# Patient Record
Sex: Female | Born: 1965 | Marital: Married
Health system: Southern US, Community
[De-identification: ages and names within clinical notes are randomized; demographics above are authoritative.]

---

## 2012-02-02 ENCOUNTER — Other Ambulatory Visit (HOSPITAL_COMMUNITY): Payer: Self-pay | Admitting: Family Medicine

## 2012-02-02 DIAGNOSIS — Z1231 Encounter for screening mammogram for malignant neoplasm of breast: Secondary | ICD-10-CM

## 2012-02-22 ENCOUNTER — Ambulatory Visit (HOSPITAL_COMMUNITY): Payer: Self-pay

## 2012-03-06 ENCOUNTER — Ambulatory Visit (HOSPITAL_COMMUNITY)
Admission: RE | Admit: 2012-03-06 | Discharge: 2012-03-06 | Disposition: A | Discharge status: Home / Self Care | Payer: Managed Care, Other (non HMO) | Source: Ambulatory Visit | Attending: Family Medicine | Admitting: Family Medicine

## 2012-03-06 DIAGNOSIS — Z1231 Encounter for screening mammogram for malignant neoplasm of breast: Secondary | ICD-10-CM | POA: Insufficient documentation

## 2012-10-02 ENCOUNTER — Other Ambulatory Visit (HOSPITAL_COMMUNITY)
Admission: RE | Admit: 2012-10-02 | Discharge: 2012-10-02 | Disposition: A | Discharge status: Home / Self Care | Payer: BC Managed Care – PPO | Source: Ambulatory Visit | Attending: Family Medicine | Admitting: Family Medicine

## 2012-10-02 ENCOUNTER — Other Ambulatory Visit: Payer: Self-pay | Admitting: Family Medicine

## 2012-10-02 DIAGNOSIS — Z124 Encounter for screening for malignant neoplasm of cervix: Secondary | ICD-10-CM | POA: Insufficient documentation

## 2012-10-04 ENCOUNTER — Other Ambulatory Visit: Payer: Self-pay | Admitting: Family Medicine

## 2012-10-04 DIAGNOSIS — E042 Nontoxic multinodular goiter: Secondary | ICD-10-CM

## 2012-10-10 ENCOUNTER — Other Ambulatory Visit: Payer: Managed Care, Other (non HMO)

## 2012-12-11 ENCOUNTER — Ambulatory Visit
Admission: RE | Admit: 2012-12-11 | Discharge: 2012-12-11 | Disposition: A | Discharge status: Home / Self Care | Payer: BC Managed Care – PPO | Source: Ambulatory Visit | Attending: Family Medicine | Admitting: Family Medicine

## 2012-12-11 DIAGNOSIS — E042 Nontoxic multinodular goiter: Secondary | ICD-10-CM

## 2012-12-14 ENCOUNTER — Other Ambulatory Visit: Payer: Self-pay | Admitting: Family Medicine

## 2012-12-14 DIAGNOSIS — E041 Nontoxic single thyroid nodule: Secondary | ICD-10-CM

## 2012-12-18 ENCOUNTER — Other Ambulatory Visit: Payer: Self-pay | Admitting: Family Medicine

## 2012-12-19 ENCOUNTER — Ambulatory Visit
Admission: RE | Admit: 2012-12-19 | Discharge: 2012-12-19 | Disposition: A | Discharge status: Home / Self Care | Payer: BC Managed Care – PPO | Source: Ambulatory Visit | Attending: Family Medicine | Admitting: Family Medicine

## 2012-12-19 ENCOUNTER — Other Ambulatory Visit (HOSPITAL_COMMUNITY)
Admission: RE | Admit: 2012-12-19 | Discharge: 2012-12-19 | Disposition: A | Discharge status: Home / Self Care | Payer: BC Managed Care – PPO | Source: Ambulatory Visit | Attending: Interventional Radiology | Admitting: Interventional Radiology

## 2012-12-19 DIAGNOSIS — E041 Nontoxic single thyroid nodule: Secondary | ICD-10-CM | POA: Insufficient documentation

## 2013-01-29 ENCOUNTER — Other Ambulatory Visit (HOSPITAL_COMMUNITY): Payer: Self-pay | Admitting: Family Medicine

## 2013-01-29 DIAGNOSIS — Z1231 Encounter for screening mammogram for malignant neoplasm of breast: Secondary | ICD-10-CM

## 2013-03-12 ENCOUNTER — Ambulatory Visit (HOSPITAL_COMMUNITY)
Admission: RE | Admit: 2013-03-12 | Discharge: 2013-03-12 | Disposition: A | Discharge status: Home / Self Care | Payer: BC Managed Care – PPO | Source: Ambulatory Visit | Attending: Family Medicine | Admitting: Family Medicine

## 2013-03-12 DIAGNOSIS — Z1231 Encounter for screening mammogram for malignant neoplasm of breast: Secondary | ICD-10-CM | POA: Insufficient documentation

## 2013-07-09 ENCOUNTER — Other Ambulatory Visit: Payer: Self-pay | Admitting: Internal Medicine

## 2013-07-09 DIAGNOSIS — E042 Nontoxic multinodular goiter: Secondary | ICD-10-CM

## 2013-09-03 ENCOUNTER — Ambulatory Visit
Admission: RE | Admit: 2013-09-03 | Discharge: 2013-09-03 | Disposition: A | Discharge status: Home / Self Care | Payer: BC Managed Care – PPO | Source: Ambulatory Visit | Attending: Internal Medicine | Admitting: Internal Medicine

## 2013-09-03 DIAGNOSIS — E042 Nontoxic multinodular goiter: Secondary | ICD-10-CM

## 2014-02-18 ENCOUNTER — Other Ambulatory Visit (HOSPITAL_COMMUNITY): Payer: Self-pay | Admitting: Family Medicine

## 2014-02-18 DIAGNOSIS — Z1231 Encounter for screening mammogram for malignant neoplasm of breast: Secondary | ICD-10-CM

## 2014-03-22 ENCOUNTER — Ambulatory Visit (HOSPITAL_COMMUNITY): Payer: PRIVATE HEALTH INSURANCE

## 2014-03-22 ENCOUNTER — Ambulatory Visit (HOSPITAL_COMMUNITY)
Admission: RE | Admit: 2014-03-22 | Discharge: 2014-03-22 | Disposition: A | Discharge status: Home / Self Care | Payer: PRIVATE HEALTH INSURANCE | Source: Ambulatory Visit | Attending: Family Medicine | Admitting: Family Medicine

## 2014-03-22 DIAGNOSIS — Z1231 Encounter for screening mammogram for malignant neoplasm of breast: Secondary | ICD-10-CM | POA: Insufficient documentation

## 2014-03-29 ENCOUNTER — Ambulatory Visit (HOSPITAL_COMMUNITY): Payer: PRIVATE HEALTH INSURANCE

## 2014-12-29 IMAGING — US US SOFT TISSUE HEAD/NECK
1 series · 13 of 25 positions shown · non-contrast
Comparison: Thyroid ultrasound from [place] 08/19/2010

CLINICAL DATA: Followup thyroid nodules. Previous thyroid biopsy on
the right February 2011 which showed benign follicular nodule.

EXAM:
THYROID ULTRASOUND
TECHNIQUE: Ultrasound examination of the thyroid gland and adjacent soft
tissues was performed.

[Series 1: us soft tissue head/neck · 0.06mm/px · 13 of 69 slices shown]
[im 1/69]
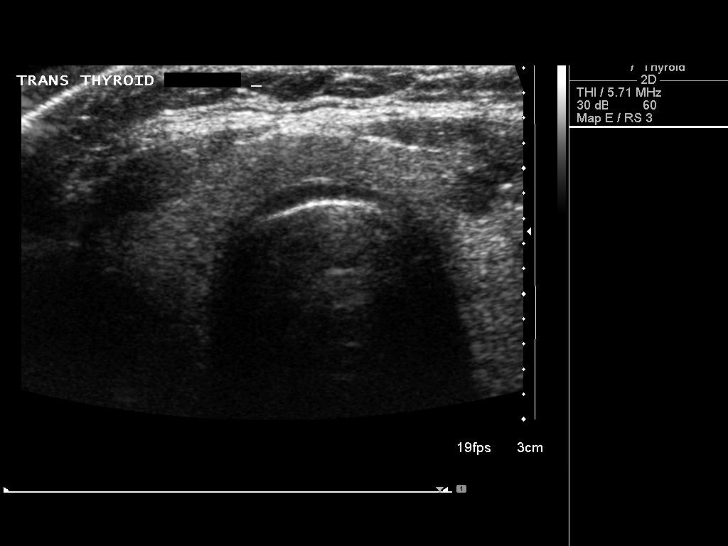
[im 6/69]
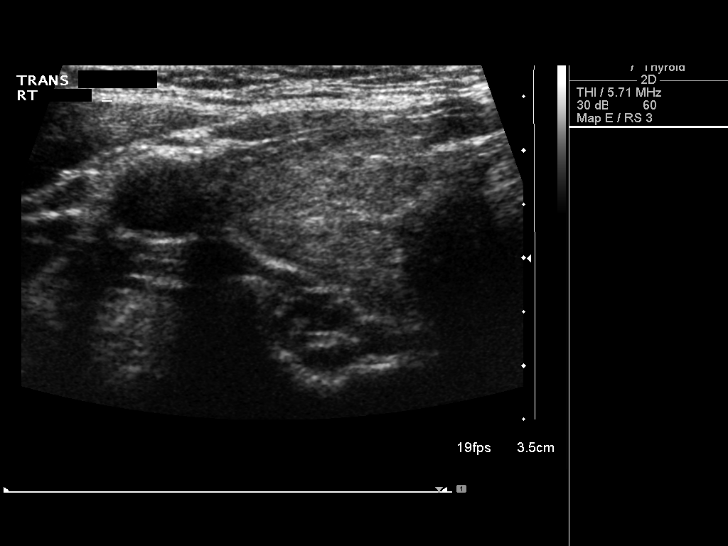
[im 12/69]
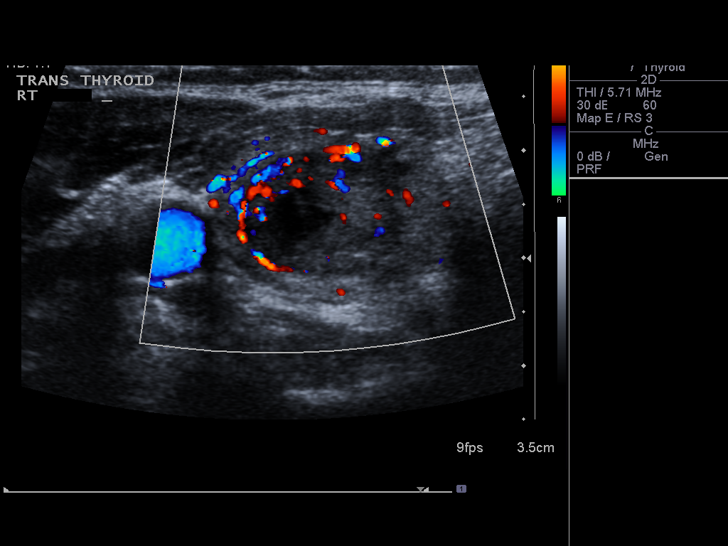
[im 18/69]
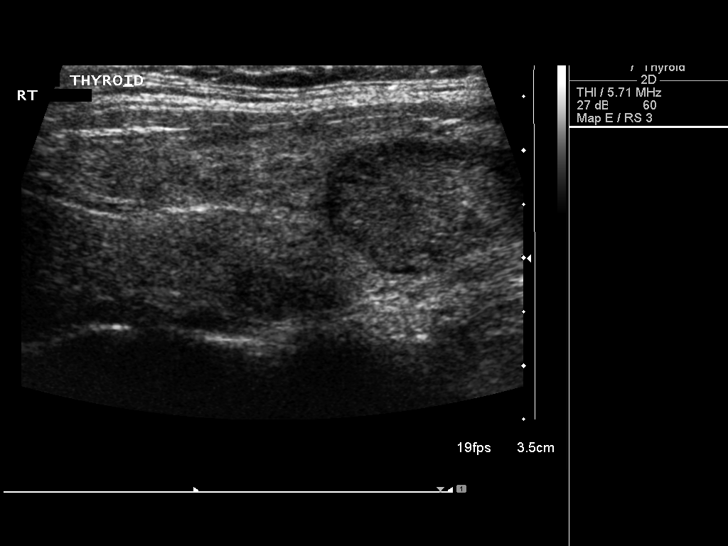
[im 23/69]
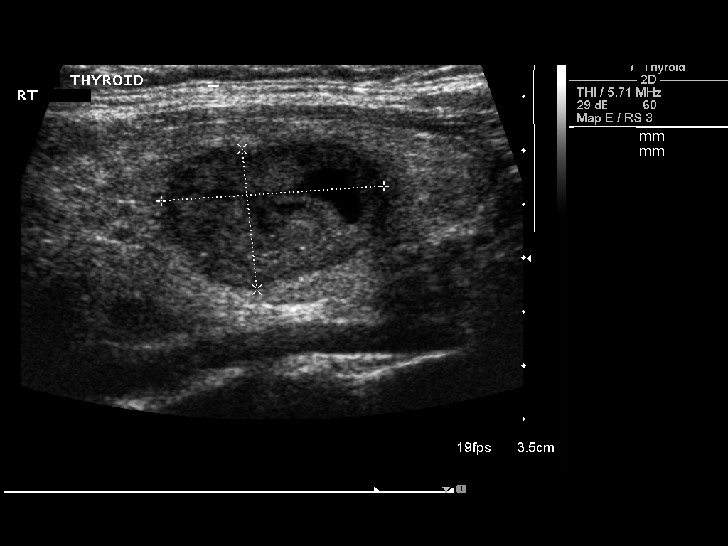
[im 29/69]
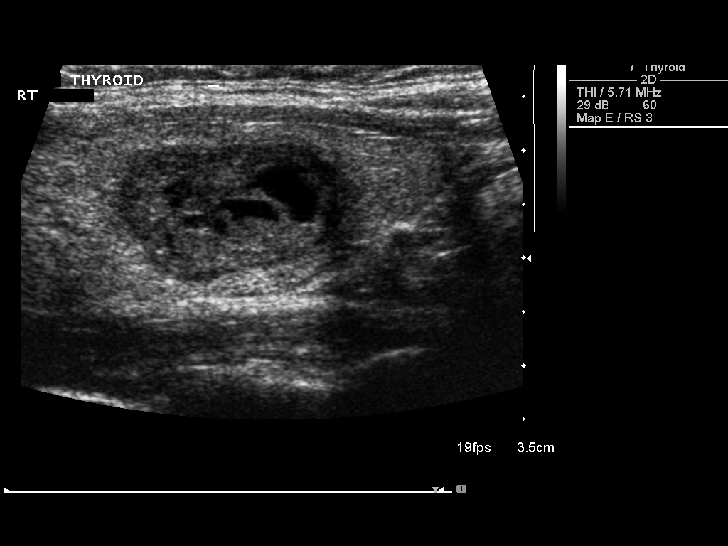
[im 35/69]
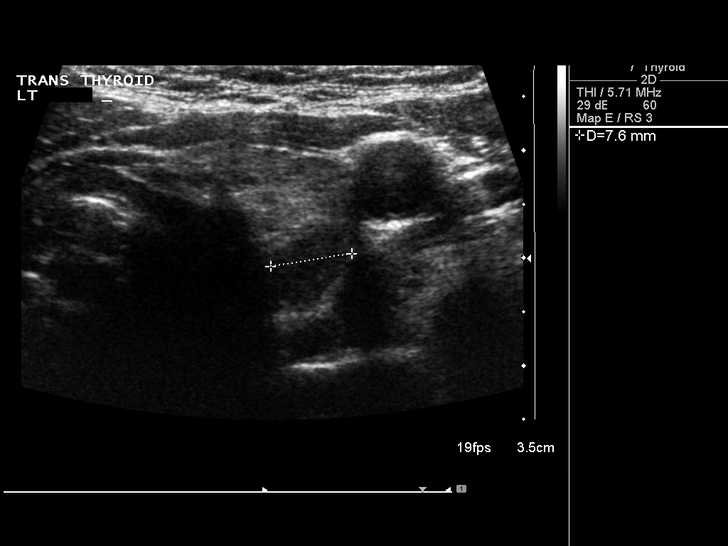
[im 40/69]
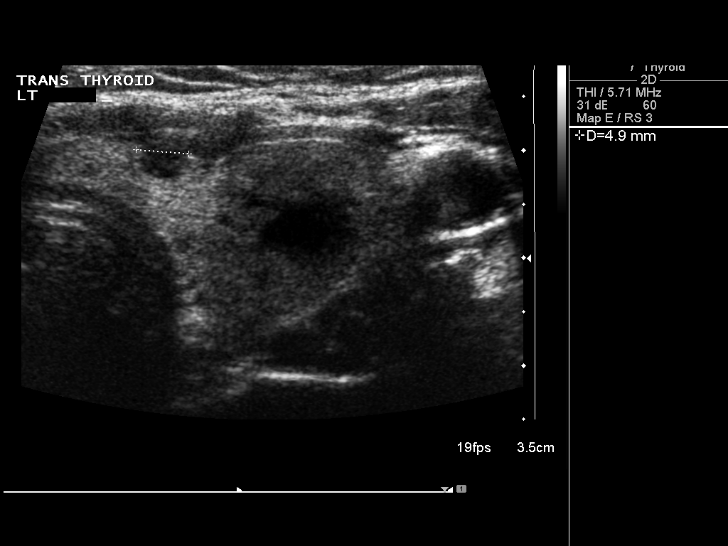
[im 46/69]
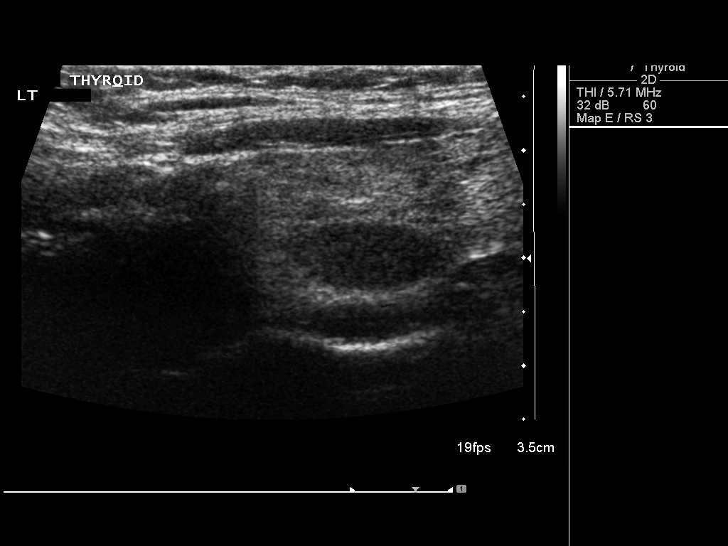
[im 52/69]
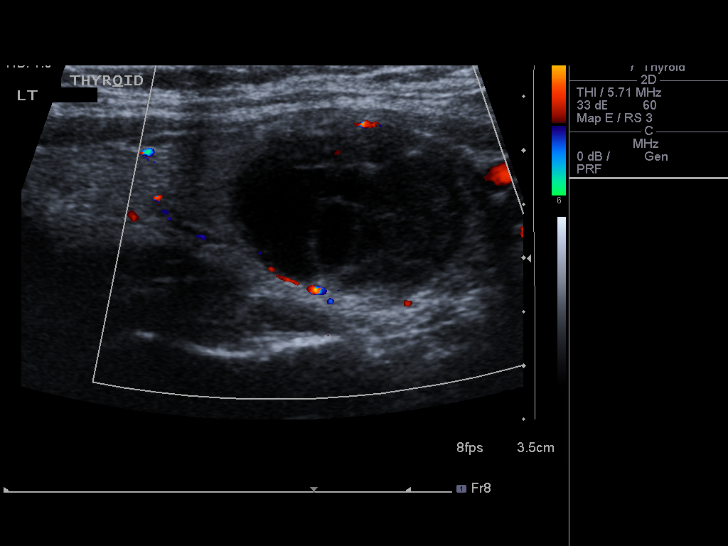
[im 57/69]
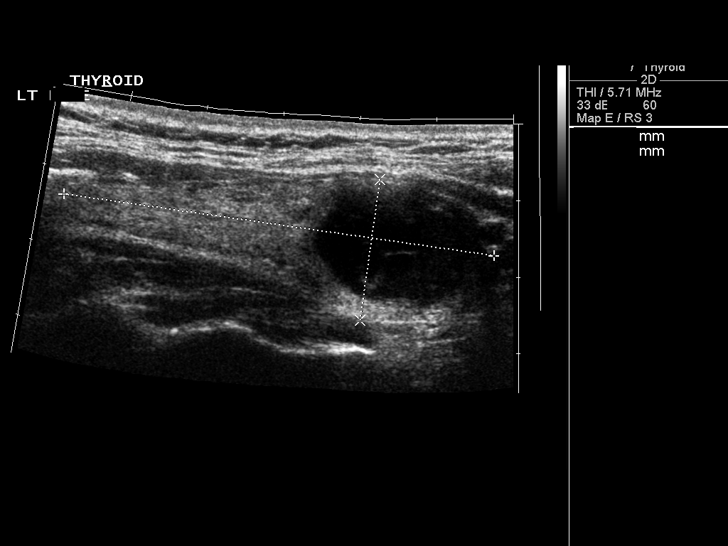
[im 63/69]
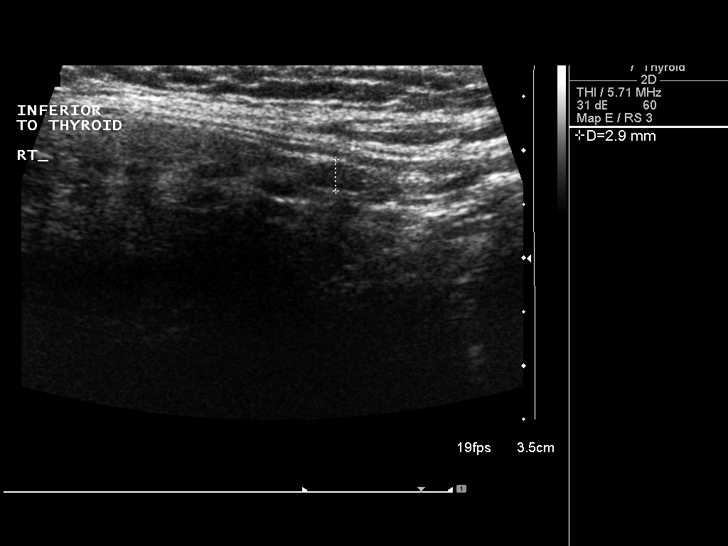
[im 69/69]
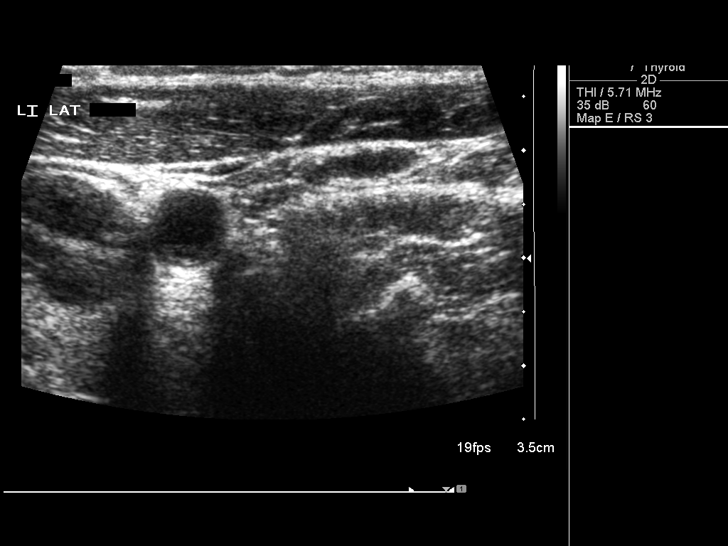

[13 of 25 positions shown; findings below may reference images not displayed]

FINDINGS: Right thyroid lobe

Measurements: 6.1 x 1.8 x 2.2 cm.

Solid right upper lobe nodule 8 mm

Solid right upper pole nodule 9 x 6 mm

Predominantly solid nodule right midpole with a small area of cystic
change measures 2.1 x 1.3 x 1.6 cm and is unchanged from the prior
ultrasound. This nodule was biopsied according to the report.

Left thyroid lobe

Measurements: 5.7 x 1.9 x 2.5 cm.

Solid left upper pole nodule 15 x 7 x 8 mm

Solid 5 mm nodule left midpole

Complex cyst left lower pole 23 x 16 x 18 mm has increased in size
from the prior study when it measured approximately 7 x 9 x 14 mm.

Isthmus

Thickness: 4 mm.  No nodules visualized.

Lymphadenopathy

Probable small bore mm lymph nodes below the lower pole of the right
lobe of the thyroid.
IMPRESSION: Dominant nodule on the right is unchanged from the prior ultrasound

Enlarging complex cyst left lower pole 23 x 16 x 18 mm. Findings
meet consensus criteria for biopsy. Ultrasound-guided fine needle
aspiration should be considered, as per the consensus statement:
Management of Thyroid Nodules Detected at US: Society of
Radiologists in Ultrasound Consensus Conference Statement. Radiology

## 2015-01-06 IMAGING — US US THYROID BIOPSY
1 series · 12 of 12 positions shown · non-contrast
Comparison: None.

CLINICAL DATA: Dominant left lobe nodule

EXAM:
ULTRASOUND GUIDED NEEDLE ASPIRATE BIOPSY OF THE THYROID GLAND

[Series 1: us thyroid biopsy · 0.07mm/px · 12 acquisitions, 12 frames shown]
[im 1/12]
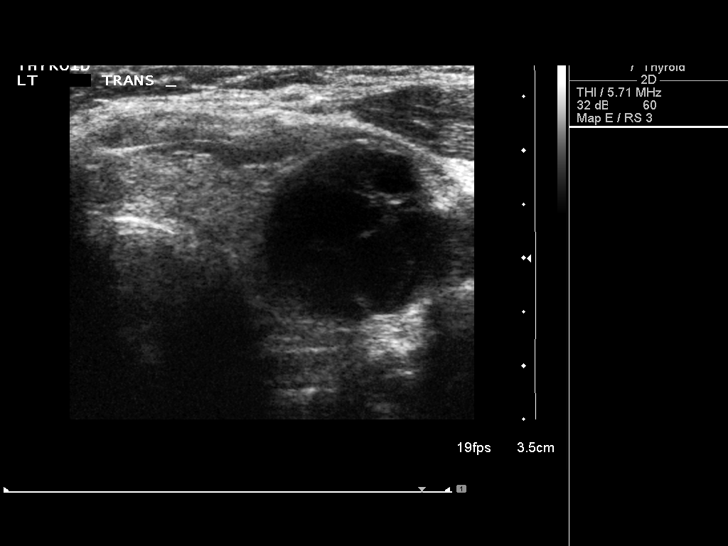
[im 2/12]
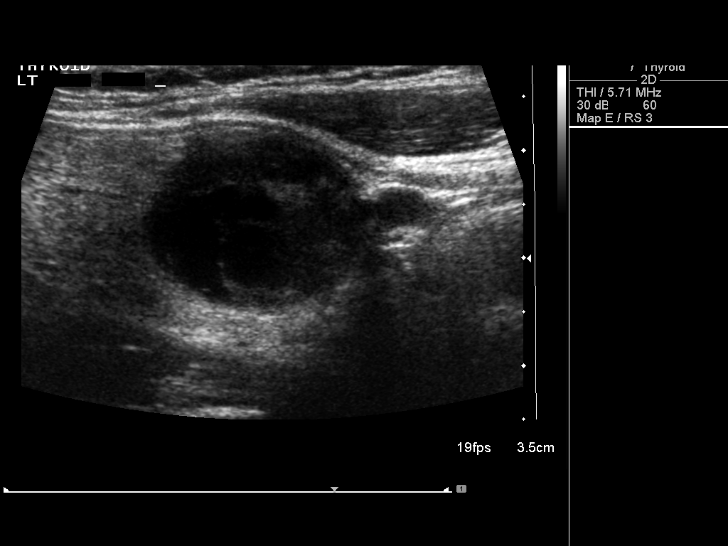
[im 3/12]
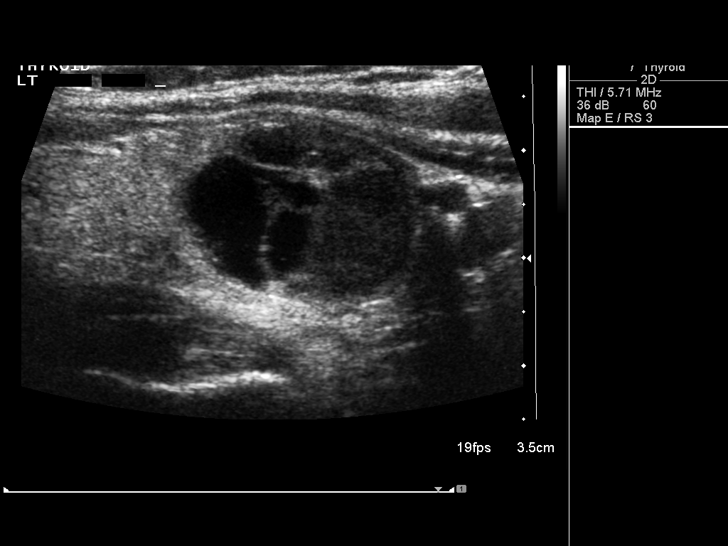
[im 4/12]
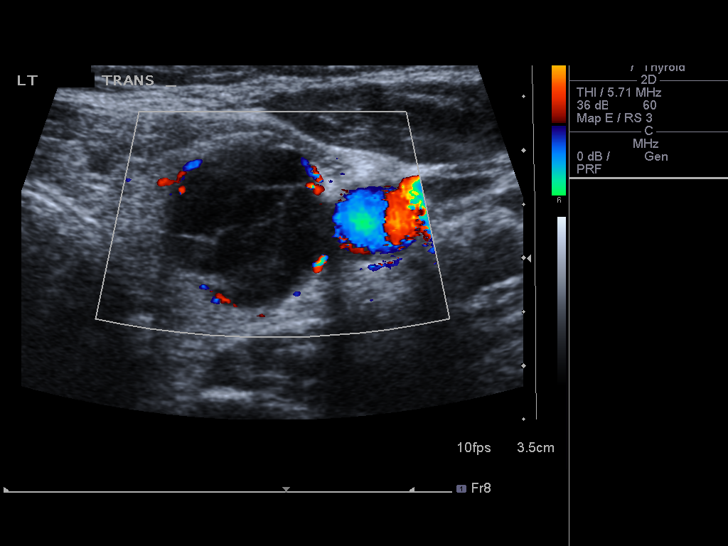
[im 5/12]
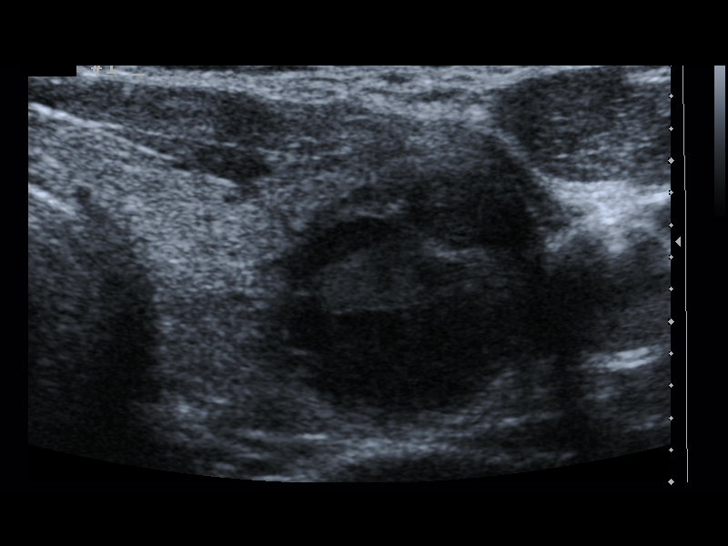
[im 6/12]
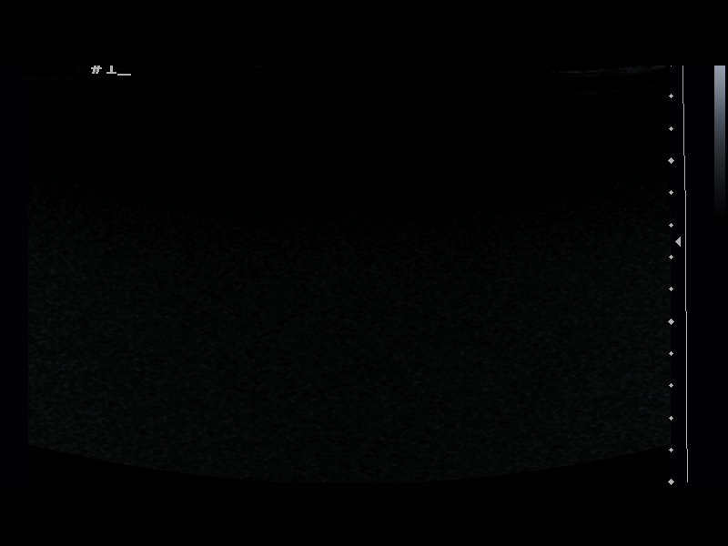
[im 7/12]
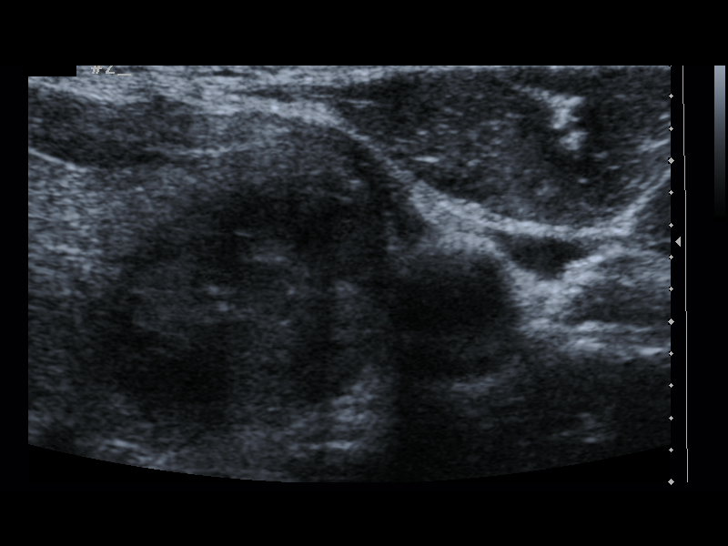
[im 8/12]
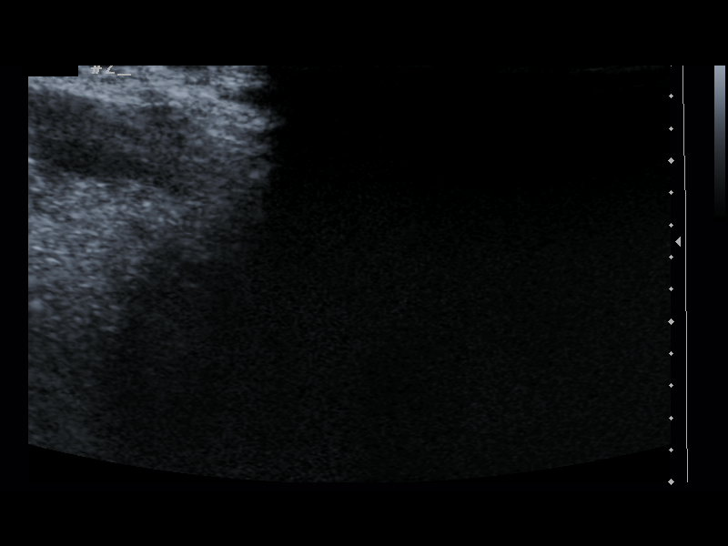
[im 9/12]
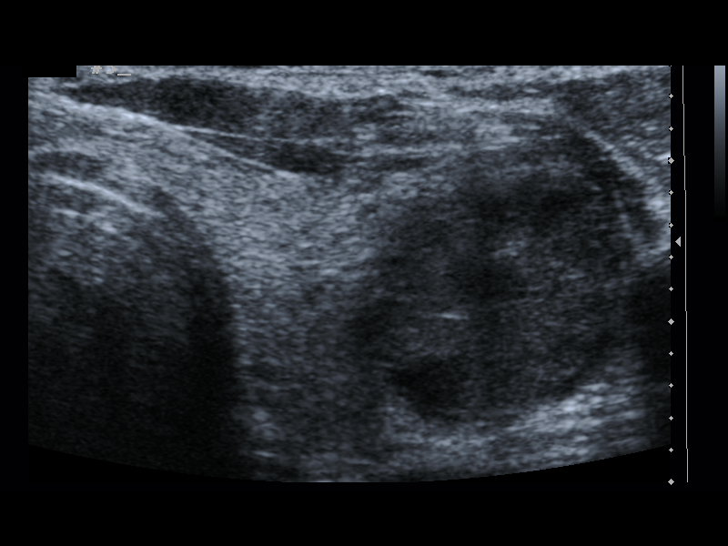
[im 10/12]
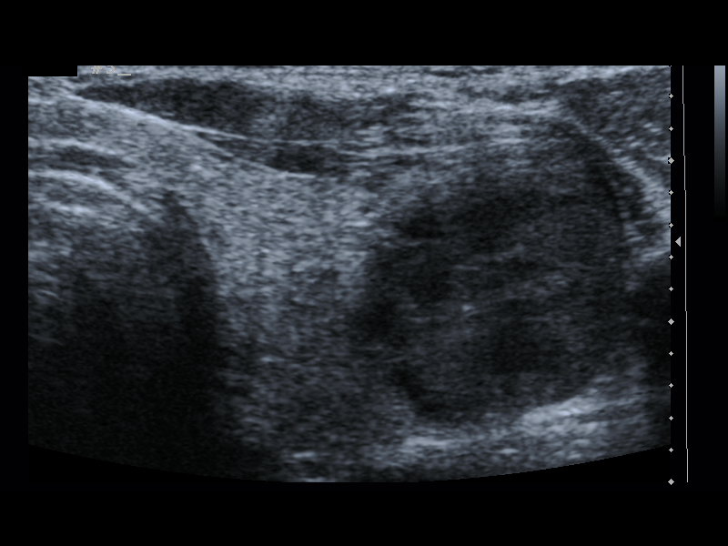
[im 11/12]
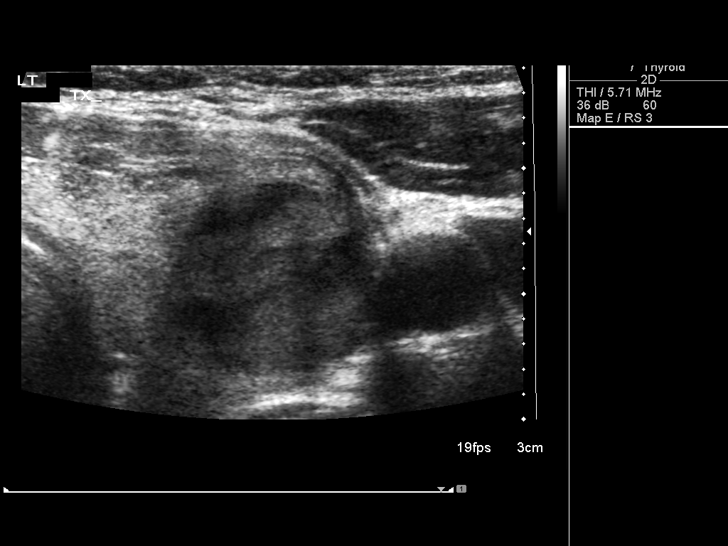
[im 12/12]
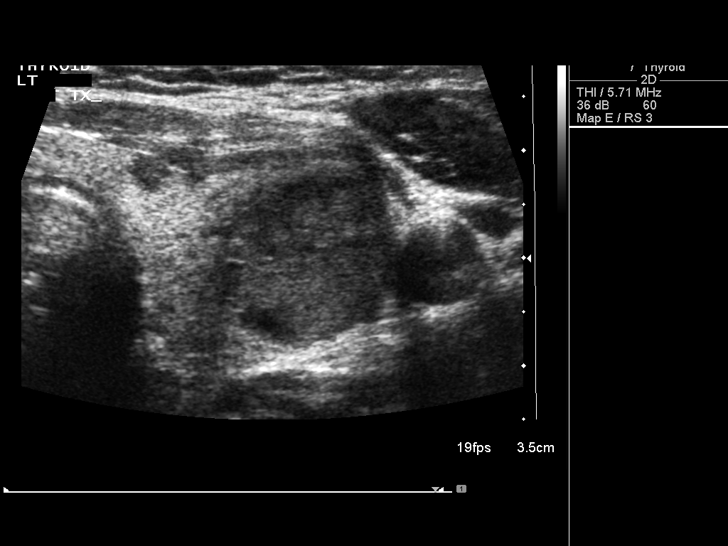

[12 of 12 positions shown; findings below may reference images not displayed]

FINDINGS: Images document needle placement in the nodule within the left lobe
of the thyroid gland.
IMPRESSION: Ultrasound guided needle aspirate biopsy performed of the left
thyroid nodule.

PROCEDURE:
Thyroid biopsy was thoroughly discussed with the patient and
questions were answered. The benefits, risks, alternatives, and
complications were also discussed. The patient understands and
wishes to proceed with the procedure. Written consent was obtained.

Ultrasound was performed to localize and mark an adequate site for
the biopsy. The patient was then prepped and draped in a normal
sterile fashion. Local anesthesia was provided with 1% lidocaine.
Using direct ultrasound guidance, 3 passes were made using needles
into the nodule within the left lobe of the thyroid. Ultrasound was
used to confirm needle placements on all occasions. Specimens were
sent to Pathology for analysis.

Complications:  None.

## 2015-09-21 IMAGING — US US SOFT TISSUE HEAD/NECK
1 series · 14 of 25 positions shown · non-contrast
Comparison: 12/11/2012

CLINICAL DATA: Follow-up nodule

EXAM:
THYROID ULTRASOUND
TECHNIQUE: Ultrasound examination of the thyroid gland and adjacent soft
tissues was performed.

[Series 1: us soft tissue head/neck · 0.10mm/px · 14 of 63 slices shown]
[im 1/63]
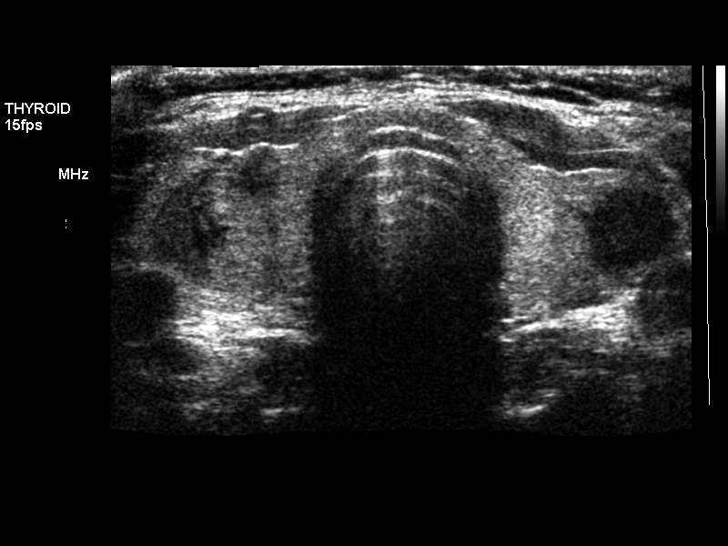
[im 6/63]
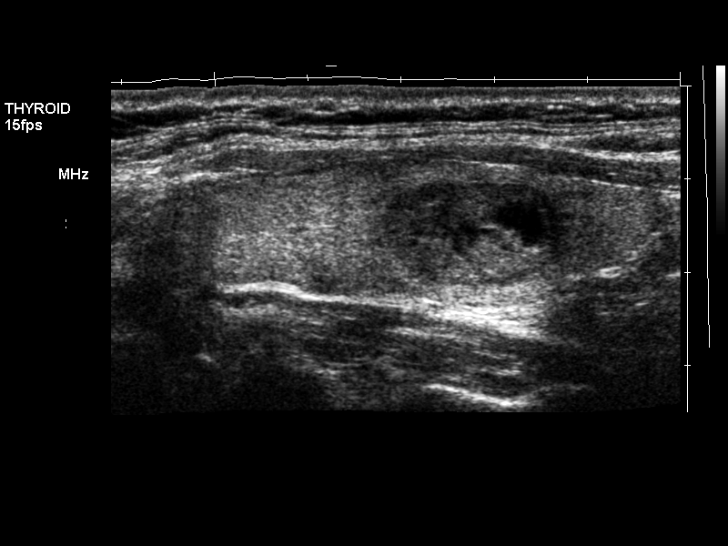
[im 11/63]
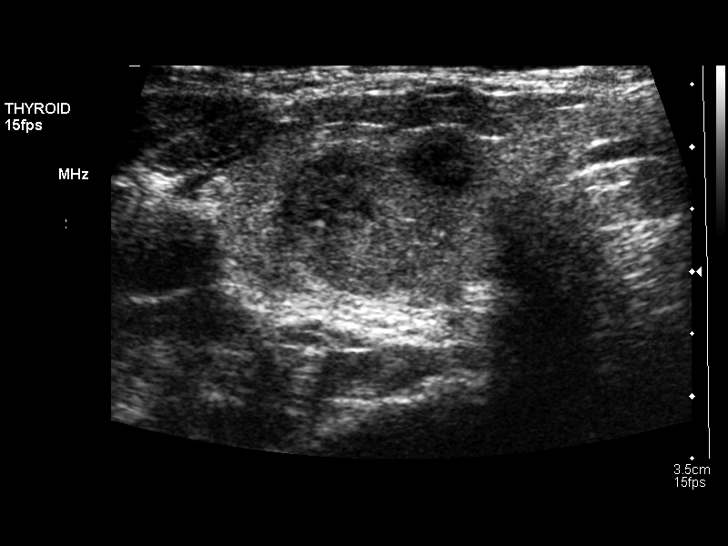
[im 16/63]
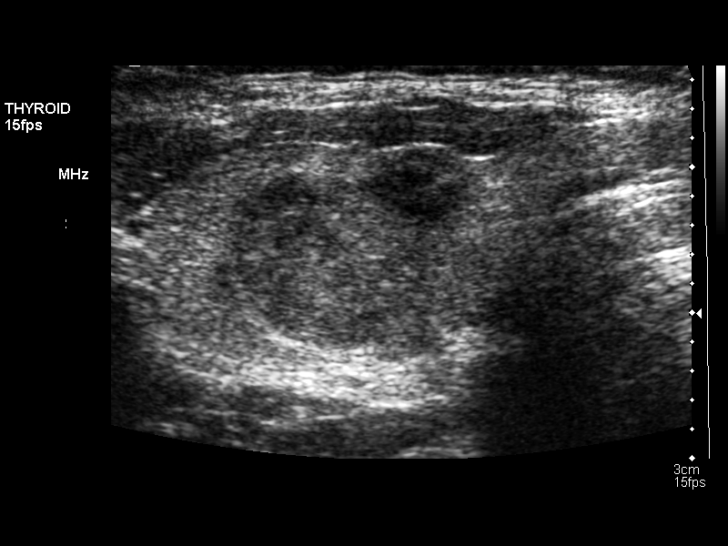
[im 21/63]
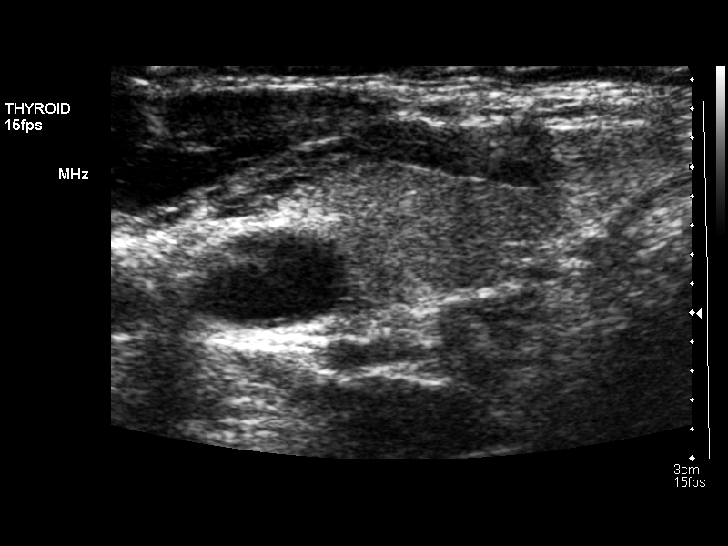
[im 24/63]
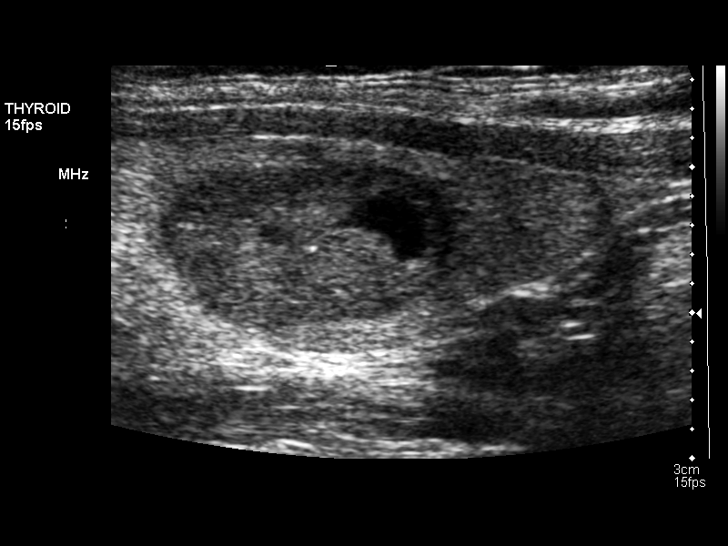
[im 29/63]
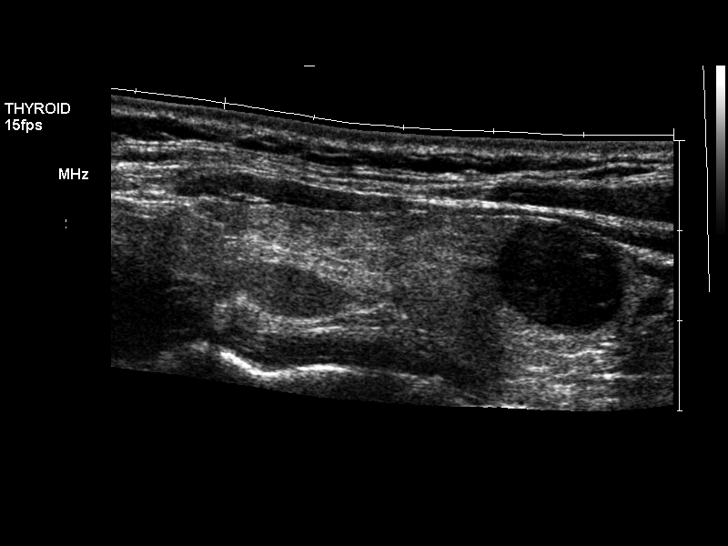
[im 34/63]
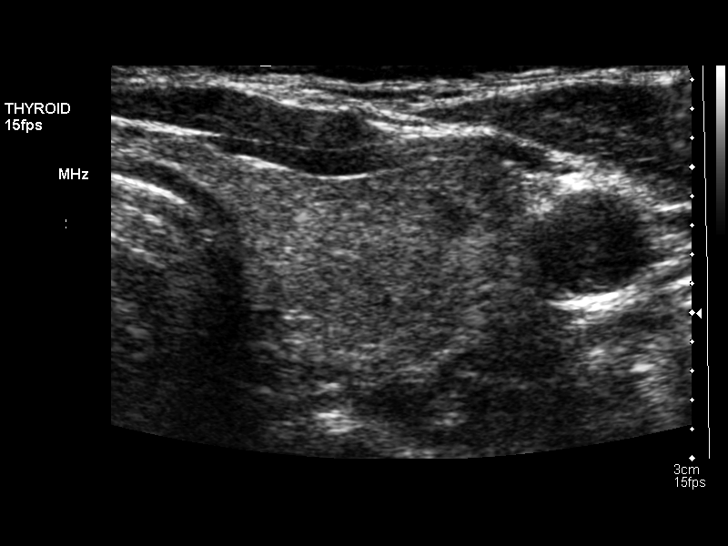
[im 39/63]
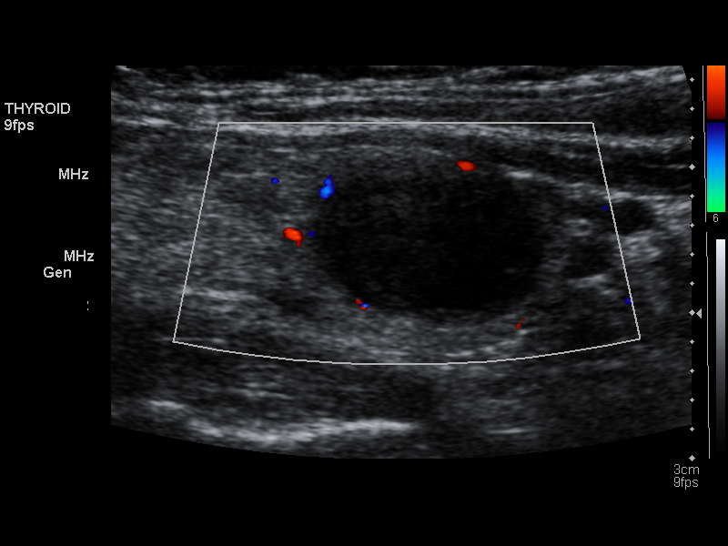
[im 42/63]
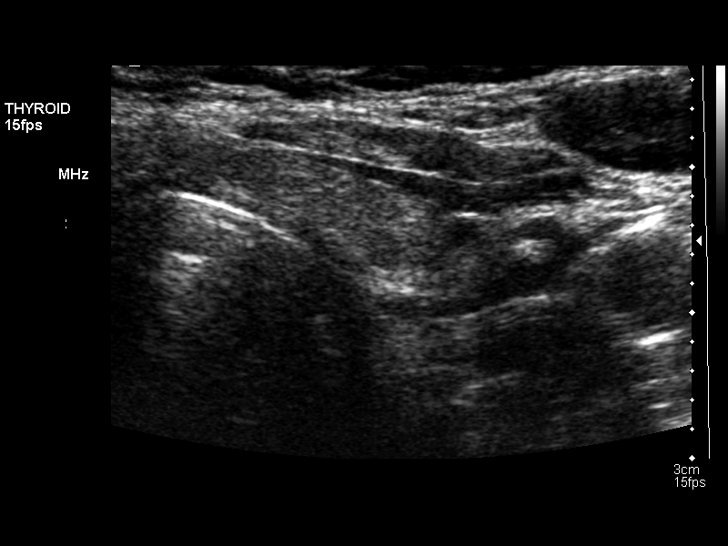
[im 47/63]
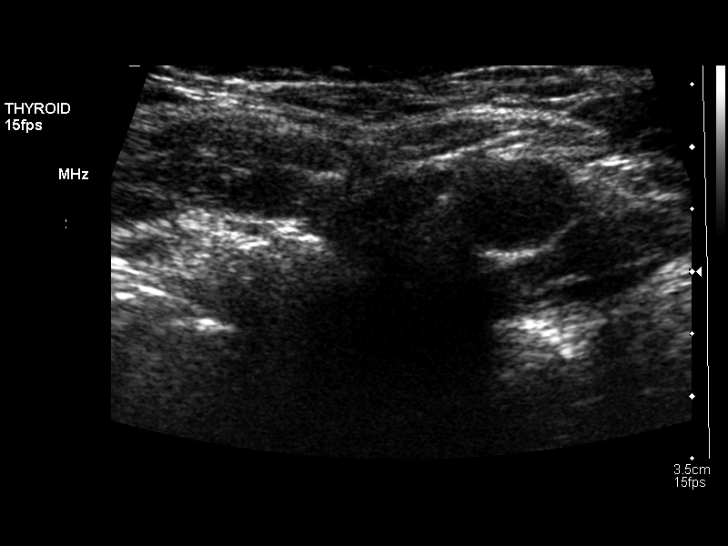
[im 52/63]
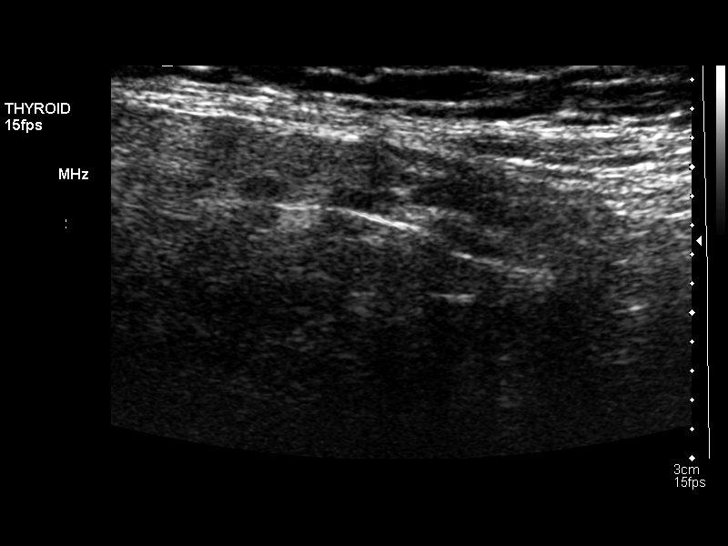
[im 57/63]
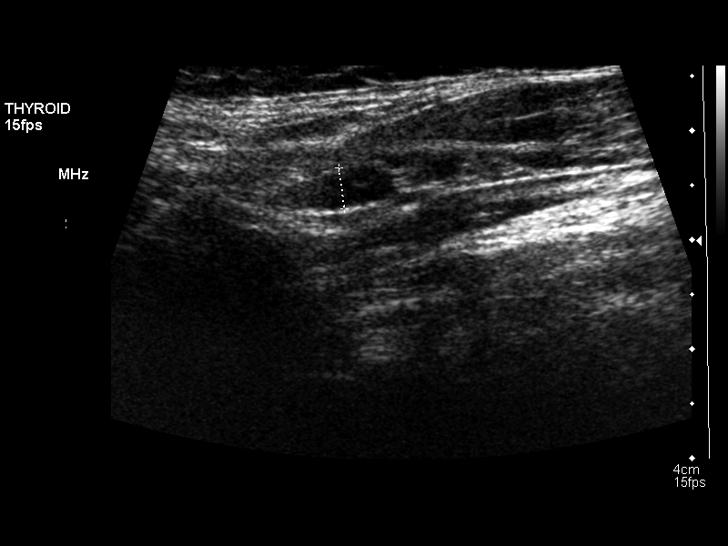
[im 63/63]
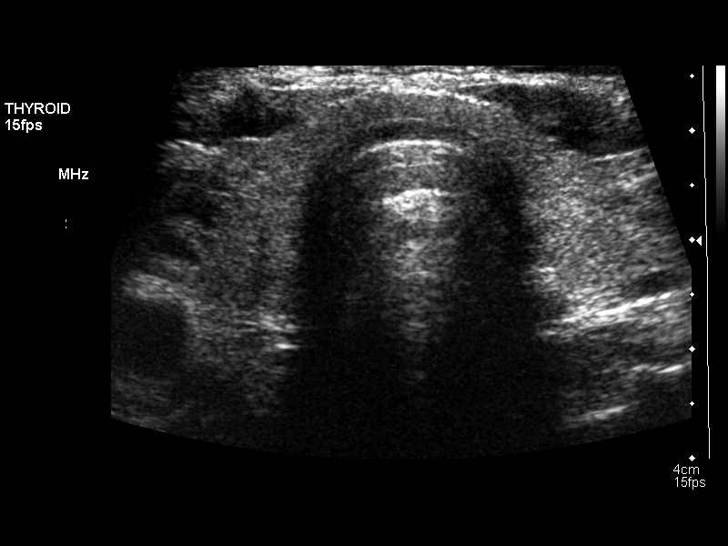

[14 of 25 positions shown; findings below may reference images not displayed]

FINDINGS: Right thyroid lobe

Measurements: 6.0 x 1.5 x 2.0 cm. Dominant interpolar region nodule
measures 1.4 x 1.3 x 2.0 cm. It is not significantly changed. Sub cm
nodules are also present.

Left thyroid lobe

Measurements: 6.0 x 1.8 x 2.1 cm. Solid left upper pole nodule
measures 1.4 x 0.6 x 0.6 cm and is not significantly changed.
Complex left lower pole nodule measures 1.2 x 1.2 x 1.7 cm and is
smaller.

Isthmus

Thickness: 3 mm in thick.  No nodules visualized.

Lymphadenopathy

None visualized.
IMPRESSION: Bilateral complex and solid nodules are either not significantly
changed or smaller.
